# Patient Record
Sex: Female | Born: 1970 | Race: White | Hispanic: No | Marital: Married | Smoking: Never smoker
Health system: Southern US, Community
[De-identification: ages and names within clinical notes are randomized; demographics above are authoritative.]

## PROBLEM LIST (undated history)

## (undated) DIAGNOSIS — N809 Endometriosis, unspecified: Secondary | ICD-10-CM

## (undated) DIAGNOSIS — M4800 Spinal stenosis, site unspecified: Secondary | ICD-10-CM

## (undated) DIAGNOSIS — N83209 Unspecified ovarian cyst, unspecified side: Secondary | ICD-10-CM

## (undated) HISTORY — PX: NASAL SEPTUM SURGERY: SHX37

## (undated) HISTORY — PX: LAPAROSCOPY: SHX197

---

## 2015-09-04 ENCOUNTER — Encounter: Payer: Self-pay | Admitting: *Deleted

## 2015-09-04 DIAGNOSIS — R109 Unspecified abdominal pain: Secondary | ICD-10-CM | POA: Insufficient documentation

## 2015-09-04 DIAGNOSIS — R200 Anesthesia of skin: Secondary | ICD-10-CM | POA: Insufficient documentation

## 2015-09-04 DIAGNOSIS — R140 Abdominal distension (gaseous): Secondary | ICD-10-CM | POA: Insufficient documentation

## 2015-09-04 DIAGNOSIS — M546 Pain in thoracic spine: Secondary | ICD-10-CM | POA: Insufficient documentation

## 2015-09-04 DIAGNOSIS — Z3202 Encounter for pregnancy test, result negative: Secondary | ICD-10-CM | POA: Diagnosis not present

## 2015-09-04 DIAGNOSIS — M545 Low back pain: Secondary | ICD-10-CM | POA: Insufficient documentation

## 2015-09-04 LAB — URINALYSIS COMPLETE WITH MICROSCOPIC (ARMC ONLY)
Bilirubin Urine: NEGATIVE
Glucose, UA: NEGATIVE mg/dL
HGB URINE DIPSTICK: NEGATIVE
KETONES UR: NEGATIVE mg/dL
LEUKOCYTES UA: NEGATIVE
NITRITE: NEGATIVE
PROTEIN: NEGATIVE mg/dL
SPECIFIC GRAVITY, URINE: 1.016 (ref 1.005–1.030)
pH: 7 (ref 5.0–8.0)

## 2015-09-04 LAB — COMPREHENSIVE METABOLIC PANEL
ALK PHOS: 38 U/L (ref 38–126)
ALT: 13 U/L — ABNORMAL LOW (ref 14–54)
ANION GAP: 5 (ref 5–15)
AST: 18 U/L (ref 15–41)
Albumin: 4.2 g/dL (ref 3.5–5.0)
BILIRUBIN TOTAL: 0.3 mg/dL (ref 0.3–1.2)
BUN: 14 mg/dL (ref 6–20)
CALCIUM: 9.1 mg/dL (ref 8.9–10.3)
CO2: 27 mmol/L (ref 22–32)
Chloride: 106 mmol/L (ref 101–111)
Creatinine, Ser: 0.7 mg/dL (ref 0.44–1.00)
GLUCOSE: 110 mg/dL — AB (ref 65–99)
POTASSIUM: 3.3 mmol/L — AB (ref 3.5–5.1)
Sodium: 138 mmol/L (ref 135–145)
TOTAL PROTEIN: 7.1 g/dL (ref 6.5–8.1)

## 2015-09-04 LAB — CBC
HEMATOCRIT: 42.6 % (ref 35.0–47.0)
HEMOGLOBIN: 14.3 g/dL (ref 12.0–16.0)
MCH: 32.3 pg (ref 26.0–34.0)
MCHC: 33.6 g/dL (ref 32.0–36.0)
MCV: 96.2 fL (ref 80.0–100.0)
Platelets: 168 10*3/uL (ref 150–440)
RBC: 4.43 MIL/uL (ref 3.80–5.20)
RDW: 12.1 % (ref 11.5–14.5)
WBC: 8.3 10*3/uL (ref 3.6–11.0)

## 2015-09-04 LAB — POCT PREGNANCY, URINE: PREG TEST UR: NEGATIVE

## 2015-09-04 LAB — LIPASE, BLOOD: Lipase: 39 U/L (ref 11–51)

## 2015-09-04 NOTE — ED Notes (Signed)
Pt c/o low back pain x 10 days. Pt c/o mid-abdominal pain starting yesterday. Pt denies dysuria and vomiting, c/o nausea. Pt drove self to ED tonight, states she could call for a ride.

## 2015-09-05 ENCOUNTER — Emergency Department: Payer: BLUE CROSS/BLUE SHIELD

## 2015-09-05 ENCOUNTER — Emergency Department
Admission: EM | Admit: 2015-09-05 | Discharge: 2015-09-05 | Disposition: A | Discharge status: Home / Self Care | Payer: BLUE CROSS/BLUE SHIELD | Attending: Emergency Medicine | Admitting: Emergency Medicine

## 2015-09-05 DIAGNOSIS — R109 Unspecified abdominal pain: Secondary | ICD-10-CM

## 2015-09-05 HISTORY — DX: Spinal stenosis, site unspecified: M48.00

## 2015-09-05 MED ORDER — IOHEXOL 300 MG/ML  SOLN
100.0000 mL | Freq: Once | INTRAMUSCULAR | Status: AC | PRN
  Administered 2015-09-05: 100 mL via INTRAVENOUS

## 2015-09-05 MED ORDER — MORPHINE SULFATE (PF) 4 MG/ML IV SOLN
4.0000 mg | Freq: Once | INTRAVENOUS | Status: DC
  Filled 2015-09-05: qty 1

## 2015-09-05 MED ORDER — IOHEXOL 240 MG/ML SOLN
25.0000 mL | INTRAMUSCULAR | Status: AC
Start: 2015-09-05 — End: 2015-09-05
  Administered 2015-09-05 (×2): 25 mL via ORAL

## 2015-09-05 MED ORDER — ONDANSETRON HCL 4 MG/2ML IJ SOLN
4.0000 mg | Freq: Once | INTRAMUSCULAR | Status: AC
  Administered 2015-09-05: 4 mg via INTRAVENOUS
  Filled 2015-09-05: qty 2

## 2015-09-05 NOTE — ED Notes (Signed)
NAD noted at time of D/C. Pt denies questions or concerns. Pt ambulatory to the lobby at this time.  

## 2015-09-05 NOTE — Discharge Instructions (Signed)
Abdominal Pain, Adult Many things can cause abdominal pain. Usually, abdominal pain is not caused by a disease and will improve without treatment. It can often be observed and treated at home. Your health care provider will do a physical exam and possibly order blood tests and X-rays to help determine the seriousness of your pain. However, in many cases, more time must pass before a clear cause of the pain can be found. Before that point, your health care provider may not know if you need more testing or further treatment. HOME CARE INSTRUCTIONS Monitor your abdominal pain for any changes. The following actions may help to alleviate any discomfort you are experiencing:  Only take over-the-counter or prescription medicines as directed by your health care provider.  Do not take laxatives unless directed to do so by your health care provider.  Try a clear liquid diet (broth, tea, or water) as directed by your health care provider. Slowly move to a bland diet as tolerated. SEEK MEDICAL CARE IF:  You have unexplained abdominal pain.  You have abdominal pain associated with nausea or diarrhea.  You have pain when you urinate or have a bowel movement.  You experience abdominal pain that wakes you in the night.  You have abdominal pain that is worsened or improved by eating food.  You have abdominal pain that is worsened with eating fatty foods.  You have a fever. SEEK IMMEDIATE MEDICAL CARE IF:  Your pain does not go away within 2 hours.  You keep throwing up (vomiting).  Your pain is felt only in portions of the abdomen, such as the right side or the left lower portion of the abdomen.  You pass bloody or black tarry stools. MAKE SURE YOU:  Understand these instructions.  Will watch your condition.  Will get help right away if you are not doing well or get worse.   This information is not intended to replace advice given to you by your health care provider. Make sure you discuss  any questions you have with your health care provider.   Document Released: 03/16/2005 Document Revised: 02/25/2015 Document Reviewed: 02/13/2013 Elsevier Interactive Patient Education [employer]2016 Elsevier Inc.   The CAT scan shows some degenerative disc disease in your back bone at about the area where the pain is. That may explain  what is causing the pain. There is also a small hernia around your belly button which only contains fat. There is really nothing else showing up on your abdominal CT ordered lab work. Please follow-up with your regular doctor when he gets back home return here for worse pain fever vomiting or feeling sicker.

## 2015-09-05 NOTE — ED Provider Notes (Signed)
[facility] Emergency Department Provider Note  ____________________________________________  Time seen: Approximately 7:11 AM  I have reviewed the triage vital signs and the nursing notes.   HISTORY  Chief Complaint Back Pain and Abdominal Pain   HPI Erica Sheppard is a 45 y.o. female patient reports about 10 days of back pain first in the mid back and occasionally going down into the low back it's across the whole back burning in nature nothing really seemed to make it better or worse she said except for Aleve making it better. In the last 2 days patient's had some abdominal pain and bloating especially in the mid abdomen. She also reported some left arm numbness usually in the morning when she wakes up she thinks she might of been sleeping on the left arm. Pain is really only about a 2 out of 10 and did not want any pain medicine here in the emergency room. She's not had any diarrhea burning or frequency or urgency with urine. She has a neurologist and her regular doctor both in [place]. She is up here to visit her father. She sees a neurologist because she had does cervical spinal stenosis in the past.   Past Medical History  Diagnosis Date  . Spinal stenosis     There are no active problems to display for this patient.   Past Surgical History  Procedure Laterality Date  . Nasal septum surgery      No current outpatient prescriptions on file.  Allergies Review of patient's allergies indicates no known allergies.  History reviewed. No pertinent family history.  Social History Social History  Substance Use Topics  . Smoking status: Never Smoker   . Smokeless tobacco: Never Used  . Alcohol Use: Yes     Comment: 3 x / week    Review of Systems Constitutional: No fever/chills Eyes: No visual changes. ENT: No sore throat. Cardiovascular: Denies chest pain. Respiratory: Denies shortness of breath. Gastrointestinal: See history of present  illness Genitourinary: Negative for dysuria. Musculoskeletal: See history of present illness Skin: Negative for rash. Neurological: Negative for headaches, see history of present illness  10-point ROS otherwise negative.  ____________________________________________   PHYSICAL EXAM:  VITAL SIGNS: ED Triage Vitals  Enc Vitals Group     BP 09/04/15 2306 113/78 mmHg     Pulse Rate 09/04/15 2306 72     Resp 09/04/15 2306 16     Temp 09/04/15 2306 97.7 F (36.5 C)     Temp Source 09/04/15 2306 Oral     SpO2 09/04/15 2306 100 %     Weight 09/04/15 2306 127 lb (57.607 kg)     Height 09/04/15 2306  (1.676 m)     Head Cir --      Peak Flow --      Pain Score 09/04/15 2334 3     Pain Loc --      Pain Edu? --      Excl. in GC? --     Constitutional: Alert and oriented. Well appearing and in no acute distress. Eyes: Conjunctivae are normal. PERRL. EOMI. Head: Atraumatic. Nose: No congestion/rhinnorhea. Mouth/Throat: Mucous membranes are moist.  Oropharynx non-erythematous. Neck: No stridor.  Cardiovascular: Normal rate, regular rhythm. Grossly normal heart sounds.  Good peripheral circulation. Respiratory: Normal respiratory effort.  No retractions. Lungs CTAB. Gastrointestinal: Soft and nontender. No distention. No abdominal bruits. No CVA tenderness. }Musculoskeletal: No lower extremity tenderness nor edema.  No joint effusions. Back not tender to palpation  or percussion or lateral compression. There is no rash. Present in the back Neurologic:  Normal speech and language. No gross focal neurologic deficits are appreciated. No gait instability. Skin:  Skin is warm, dry and intact. No rash noted. Psychiatric: Mood and affect are normal. Speech and behavior are normal.  ____________________________________________   LABS (all labs ordered are listed, but only abnormal results are displayed)  Labs Reviewed  COMPREHENSIVE METABOLIC PANEL - Abnormal; Notable for the  following:    Potassium 3.3 (*)    Glucose, Bld 110 (*)    ALT 13 (*)    All other components within normal limits  URINALYSIS COMPLETEWITH MICROSCOPIC ([facility] ONLY) - Abnormal; Notable for the following:    Color, Urine YELLOW (*)    APPearance HAZY (*)    Bacteria, UA MANY (*)    Squamous Epithelial / LPF 0-5 (*)    All other components within normal limits  LIPASE, BLOOD  CBC  POCT PREGNANCY, URINE   ____________________________________________  EKG   ____________________________________________  RADIOLOGY  CT of the abdomen read by radiology as essentially negative there is small amount of physiologic fluid and a possible collapsed right ovarian cyst or follicle there is also some DJD at T 11 and 12 and a small containing umbilical hernia. ____________________________________________   PROCEDURES    ____________________________________________   INITIAL IMPRESSION / ASSESSMENT AND PLAN / ED COURSE  Pertinent labs & imaging results that were available during my care of the patient were reviewed by me and considered in my medical decision making (see chart for details).   ____________________________________________   FINAL CLINICAL IMPRESSION(S) / ED DIAGNOSES  Final diagnoses:  Abdominal pain, unspecified abdominal location      Arnaldo NatalPaul F Malinda, MD 09/05/15 [phone]

## 2015-11-16 ENCOUNTER — Emergency Department: Payer: BLUE CROSS/BLUE SHIELD

## 2015-11-16 ENCOUNTER — Emergency Department
Admission: EM | Admit: 2015-11-16 | Discharge: 2015-11-16 | Disposition: A | Discharge status: Home / Self Care | Payer: BLUE CROSS/BLUE SHIELD | Attending: Emergency Medicine | Admitting: Emergency Medicine

## 2015-11-16 ENCOUNTER — Encounter: Payer: Self-pay | Admitting: Emergency Medicine

## 2015-11-16 DIAGNOSIS — R1011 Right upper quadrant pain: Secondary | ICD-10-CM | POA: Diagnosis present

## 2015-11-16 DIAGNOSIS — K5900 Constipation, unspecified: Secondary | ICD-10-CM | POA: Diagnosis not present

## 2015-11-16 HISTORY — DX: Unspecified ovarian cyst, unspecified side: N83.209

## 2015-11-16 HISTORY — DX: Endometriosis, unspecified: N80.9

## 2015-11-16 LAB — CBC
HCT: 43.3 % (ref 35.0–47.0)
Hemoglobin: 14.6 g/dL (ref 12.0–16.0)
MCH: 32.2 pg (ref 26.0–34.0)
MCHC: 33.6 g/dL (ref 32.0–36.0)
MCV: 95.9 fL (ref 80.0–100.0)
Platelets: 186 10*3/uL (ref 150–440)
RBC: 4.52 MIL/uL (ref 3.80–5.20)
RDW: 12.8 % (ref 11.5–14.5)
WBC: 5.1 10*3/uL (ref 3.6–11.0)

## 2015-11-16 LAB — URINALYSIS COMPLETE WITH MICROSCOPIC (ARMC ONLY)
BILIRUBIN URINE: NEGATIVE
GLUCOSE, UA: NEGATIVE mg/dL
Ketones, ur: NEGATIVE mg/dL
Leukocytes, UA: NEGATIVE
NITRITE: NEGATIVE
Protein, ur: NEGATIVE mg/dL
RBC / HPF: NONE SEEN RBC/hpf (ref 0–5)
SPECIFIC GRAVITY, URINE: 1.008 (ref 1.005–1.030)
WBC, UA: NONE SEEN WBC/hpf (ref 0–5)
pH: 7 (ref 5.0–8.0)

## 2015-11-16 LAB — COMPREHENSIVE METABOLIC PANEL
ALBUMIN: 4.5 g/dL (ref 3.5–5.0)
ALK PHOS: 40 U/L (ref 38–126)
ALT: 12 U/L — AB (ref 14–54)
AST: 16 U/L (ref 15–41)
Anion gap: 6 (ref 5–15)
BILIRUBIN TOTAL: 0.8 mg/dL (ref 0.3–1.2)
BUN: 14 mg/dL (ref 6–20)
CALCIUM: 9.4 mg/dL (ref 8.9–10.3)
CO2: 27 mmol/L (ref 22–32)
CREATININE: 0.93 mg/dL (ref 0.44–1.00)
Chloride: 108 mmol/L (ref 101–111)
GFR calc Af Amer: >60 mL/min (ref >60)
GFR calc non Af Amer: >60 mL/min (ref >60)
GLUCOSE: 102 mg/dL — AB (ref 65–99)
Potassium: 4.1 mmol/L (ref 3.5–5.1)
Sodium: 141 mmol/L (ref 135–145)
TOTAL PROTEIN: 7.3 g/dL (ref 6.5–8.1)

## 2015-11-16 LAB — TROPONIN I

## 2015-11-16 LAB — POCT PREGNANCY, URINE: PREG TEST UR: NEGATIVE

## 2015-11-16 LAB — LIPASE, BLOOD: Lipase: 43 U/L (ref 11–51)

## 2015-11-16 MED ORDER — GI COCKTAIL ~~LOC~~
30.0000 mL | Freq: Once | ORAL | Status: AC
  Administered 2015-11-16: 30 mL via ORAL
  Filled 2015-11-16: qty 30

## 2015-11-16 MED ORDER — POLYETHYLENE GLYCOL 3350 17 G PO PACK: 17.0000 g | PACK | Freq: Every day | ORAL | Status: AC | PRN

## 2015-11-16 MED ORDER — RANITIDINE HCL 150 MG PO TABS: 150.0000 mg | ORAL_TABLET | Freq: Two times a day (BID) | ORAL | Status: AC

## 2015-11-16 NOTE — ED Provider Notes (Addendum)
[facility] Emergency Department Provider Note   ____________________________________________  Time seen: Approximately 210 PM  I have reviewed the triage vital signs and the nursing notes.   HISTORY  Chief Complaint Abdominal Pain   HPI Erica Sheppard is a 45 y.o. female with a history of spinal stenosis as well as endometriosis who is presenting to the emergency department today with upper abdominal pain. Says the pain feels like burning and is a 3 out of 10. She denies any radiation of the back. Says that she is also having intermittent constipation and had her first bowel movement this morning in 5 days. Says that the bowel movement was greenish and floated which made her concerned coming to the emergency department. She says that she "feels full" when she eats but denies any nausea or vomiting. She says the pain is worse to the right upper quadrant but is not worsened with eating. She has had a CAT scan as well as a pelvic ultrasound which has not revealed any pathology except for an involuting ovarian cyst. Patient denies any burning with urination. She says that she has tried milk of magnesia over the past several days for this but no other medications. Says the pain worsens with movement. Says that she also had a colonoscopy several years ago which did not reveal any pathology. She had this colonoscopy because she was having abdominal pain at that time as well. She did see her GI doctor several months ago for this current issue and was cleared to return in one year for follow-up. She drinks intermittently but not daily. Says that she has been more stressed over the past several months because her father is "sick."   Past Medical History  Diagnosis Date  . Spinal stenosis   . Endometriosis   . Ovarian cyst     There are no active problems to display for this patient.   Past Surgical History  Procedure Laterality Date  . Nasal septum surgery    .  Laparoscopy      No current outpatient prescriptions on file.  Allergies Review of patient's allergies indicates no known allergies.  No family history on file.  Social History Social History  Substance Use Topics  . Smoking status: Never Smoker   . Smokeless tobacco: Never Used  . Alcohol Use: Yes     Comment: 3 x / week    Review of Systems Constitutional: No fever/chills Eyes: No visual changes. ENT: No sore throat. Cardiovascular: Denies chest pain. Respiratory: Denies shortness of breath. Gastrointestinal:   No nausea, no vomiting.   Genitourinary: Negative for dysuria. Musculoskeletal: Negative for back pain. Skin: Negative for rash. Neurological: Negative for headaches, focal weakness or numbness.  10-point ROS otherwise negative.  ____________________________________________   PHYSICAL EXAM:  VITAL SIGNS: ED Triage Vitals  Enc Vitals Group     BP 11/16/15 1217 114/77 mmHg     Pulse Rate 11/16/15 1217 75     Resp 11/16/15 1217 18     Temp 11/16/15 1217 97.6 F (36.4 C)     Temp Source 11/16/15 1217 Oral     SpO2 11/16/15 1217 100 %     Weight 11/16/15 1217 127 lb (57.607 kg)     Height 11/16/15 1217 5\' 6"  (1.676 m)     Head Cir --      Peak Flow --      Pain Score 11/16/15 1218 2     Pain Loc --  Pain Edu? --      Excl. in GC? --     Constitutional: Alert and oriented. Well appearing and in no acute distress. Eyes: Conjunctivae are normal. PERRL. EOMI. Head: Atraumatic. Nose: No congestion/rhinnorhea. Mouth/Throat: Mucous membranes are moist.   Neck: No stridor.   Cardiovascular: Normal rate, regular rhythm. Grossly normal heart sounds.  Respiratory: Normal respiratory effort.  No retractions. Lungs CTAB. Gastrointestinal: Soft With mild tenderness to palpation throughout. No distention. No CVA tenderness. Musculoskeletal: No lower extremity tenderness nor edema.  No joint effusions. Neurologic:  Normal speech and language. No gross focal  neurologic deficits are appreciated. No gait instability. Skin:  Skin is warm, dry and intact. No rash noted. Psychiatric: Mood and affect are normal. Speech and behavior are normal.  ____________________________________________   LABS (all labs ordered are listed, but only abnormal results are displayed)  Labs Reviewed  COMPREHENSIVE METABOLIC PANEL - Abnormal; Notable for the following:    Glucose, Bld 102 (*)    ALT 12 (*)    All other components within normal limits  URINALYSIS COMPLETEWITH MICROSCOPIC ([facility] ONLY) - Abnormal; Notable for the following:    Color, Urine STRAW (*)    APPearance CLEAR (*)    Hgb urine dipstick 1+ (*)    Bacteria, UA RARE (*)    Squamous Epithelial / LPF 0-5 (*)    All other components within normal limits  LIPASE, BLOOD  CBC  TROPONIN I  POC URINE PREG, ED  POCT PREGNANCY, URINE   ____________________________________________  EKG ED ECG REPORT I, Schaevitz,  Teena Iraniavid M, the attending physician, personally viewed and interpreted this ECG.   Date: 11/16/2015  EKG Time: 1223  Rate: 70  Rhythm: normal sinus rhythm  Axis: Normal axis  Intervals:none  ST&T Change: No ST segment elevation or depression. No abnormal T-wave inversion.   ____________________________________________  RADIOLOGY  US Abdomen Limited RUQ (Final result) Result time: 11/16/15 15:52:48   Final result by Rad Results In Interface (11/16/15 15:52:48)   Narrative:   CLINICAL DATA: Right upper quadrant pain  EXAM: US ABDOMEN LIMITED - RIGHT UPPER QUADRANT  COMPARISON: 09/05/2015  FINDINGS: Gallbladder:  No gallstones or wall thickening visualized. No sonographic Murphy sign noted by sonographer.  Common bile duct:  Diameter: 5 mm.  Liver:  Cystic lesion is noted within the left lobe of the liver.  IMPRESSION: Left lobe cystic lesions stable from the prior CT examination. No other focal abnormality is noted.   Electronically Signed By: Alcide CleverMark  Lukens M.D. On: 11/16/2015 15:52    ____________________________________________   PROCEDURES  ____________________________________________   INITIAL IMPRESSION / ASSESSMENT AND PLAN / ED COURSE  Pertinent labs & imaging results that were available during my care of the patient were reviewed by me and considered in my medical decision making (see chart for details).  ----------------------------------------- 4:10 PM on 11/16/2015 -----------------------------------------  Patient says that the pain is reduced after taking the GI cocktail. She says the pain is completely relieved on the left side but still a 2 out of 10 to the right upper quadrant. I reviewed the lab as well as imaging results with the patient as well as her mother who is now at the bedside.  We discussed the finding of the 1.5 cm, unchanged liver cyst. The mother was also concerned about the patient's pancreas because of the history of pancreatic cancer in the family. There was no noting of pancreatic pathology on the previous CAT scan. The patient had normal biliary findings as  well as no elevations in her liver labs and bilirubin which he says there is no obstructive process. I'll be discharging the patient with MiraLAX as well as Zantac. Possibly symptoms related to constipation. She'll be following back up with her gastroenterologist in [place] where she lives. She says she is here visiting family at this time. ____________________________________________   FINAL CLINICAL IMPRESSION(S) / ED DIAGNOSES  Final diagnoses:  RUQ abdominal pain  Constipation.    NEW MEDICATIONS STARTED DURING THIS VISIT:  New Prescriptions   No medications on file     Note:  This document was prepared using Dragon voice recognition software and may include unintentional dictation errors.    Myrna Blazer, MD 11/16/15 1614  I also reviewed the scout film from the CAT scan as well as the lung fields which did  not reveal any pathology to the lower fields.  Myrna Blazer, MD 11/16/15 [phone]

## 2015-11-16 NOTE — ED Notes (Signed)
Upper R abdominal pain intermittent x 2 months.

## 2015-11-16 NOTE — Discharge Instructions (Signed)
Abdominal Pain, Adult °Many things can cause abdominal pain. Usually, abdominal pain is not caused by a disease and will improve without treatment. It can often be observed and treated at home. Your health care provider will do a physical exam and possibly order blood tests and X-rays to help determine the seriousness of your pain. However, in many cases, more time must pass before a clear cause of the pain can be found. Before that point, your health care provider may not know if you need more testing or further treatment. °HOME CARE INSTRUCTIONS °Monitor your abdominal pain for any changes. The following actions may help to alleviate any discomfort you are experiencing: °· Only take over-the-counter or prescription medicines as directed by your health care provider. °· Do not take laxatives unless directed to do so by your health care provider. °· Try a clear liquid diet (broth, tea, or water) as directed by your health care provider. Slowly move to a bland diet as tolerated. °SEEK MEDICAL CARE IF: °· You have unexplained abdominal pain. °· You have abdominal pain associated with nausea or diarrhea. °· You have pain when you urinate or have a bowel movement. °· You experience abdominal pain that wakes you in the night. °· You have abdominal pain that is worsened or improved by eating food. °· You have abdominal pain that is worsened with eating fatty foods. °· You have a fever. °SEEK IMMEDIATE MEDICAL CARE IF: °· Your pain does not go away within 2 hours. °· You keep throwing up (vomiting). °· Your pain is felt only in portions of the abdomen, such as the right side or the left lower portion of the abdomen. °· You pass bloody or black tarry stools. °MAKE SURE YOU: °· Understand these instructions. °· Will watch your condition. °· Will get help right away if you are not doing well or get worse. °  °This information is not intended to replace advice given to you by your health care provider. Make sure you discuss  any questions you have with your health care provider. °  °Document Released: 03/16/2005 Document Revised: 02/25/2015 Document Reviewed: 02/13/2013 °Elsevier Interactive Patient Education ©2016 Else[employer] Inc. ° °Constipation, Adult °Constipation is when a person has fewer than three bowel movements a week, has difficulty having a bowel movement, or has stools that are dry, hard, or larger than normal. As people grow older, constipation is more common. A low-fiber diet, not taking in enough fluids, and taking certain medicines may make constipation worse.  °CAUSES  °· Certain medicines, such as antidepressants, pain medicine, iron supplements, antacids, and water pills.   °· Certain diseases, such as diabetes, irritable bowel syndrome (IBS), thyroid disease, or depression.   °· Not drinking enough water.   °· Not eating enough fiber-rich foods.   °· Stress or travel.   °· Lack of physical activity or exercise.   °· Ignoring the urge to have a bowel movement.   °· Using laxatives too much.   °SIGNS AND SYMPTOMS  °· Having fewer than three bowel movements a week.   °· Straining to have a bowel movement.   °· Having stools that are hard, dry, or larger than normal.   °· Feeling full or bloated.   °· Pain in the lower abdomen.   °· Not feeling relief after having a bowel movement.   °DIAGNOSIS  °Your health care provider will take a medical history and perform a physical exam. Further testing may be done for severe constipation. Some tests may include: °· A barium enema X-ray to examine your rectum, colon, and, sometimes,   your small intestine.   °· A sigmoidoscopy to examine your lower colon.   °· A colonoscopy to examine your entire colon. °TREATMENT  °Treatment will depend on the severity of your constipation and what is causing it. Some dietary treatments include drinking more fluids and eating more fiber-rich foods. Lifestyle treatments may include regular exercise. If these diet and lifestyle recommendations do not  help, your health care provider may recommend taking over-the-counter laxative medicines to help you have bowel movements. Prescription medicines may be prescribed if over-the-counter medicines do not work.  °HOME CARE INSTRUCTIONS  °· Eat foods that have a lot of fiber, such as fruits, vegetables, whole grains, and beans. °· Limit foods high in fat and processed sugars, such as french fries, hamburgers, cookies, candies, and soda.   °· A fiber supplement may be added to your diet if you cannot get enough fiber from foods.   °· Drink enough fluids to keep your urine clear or pale yellow.   °· Exercise regularly or as directed by your health care provider.   °· Go to the restroom when you have the urge to go. Do not hold it.   °· Only take over-the-counter or prescription medicines as directed by your health care provider. Do not take other medicines for constipation without talking to your health care provider first.   °SEEK IMMEDIATE MEDICAL CARE IF:  °· You have bright red blood in your stool.   °· Your constipation lasts for more than 4 days or gets worse.   °· You have abdominal or rectal pain.   °· You have thin, pencil-like stools.   °· You have unexplained weight loss. °MAKE SURE YOU:  °· Understand these instructions. °· Will watch your condition. °· Will get help right away if you are not doing well or get worse. °  °This information is not intended to replace advice given to you by your health care provider. Make sure you discuss any questions you have with your health care provider. °  °Document Released: 03/04/2004 Document Revised: 06/27/2014 Document Reviewed: 03/18/2013 °Elsevier Interactive Patient Education ©2016 [employer] ° °

## 2017-02-21 IMAGING — US US ABDOMEN LIMITED
1 series · 14 of 25 positions shown · non-contrast
Comparison: 09/05/2015

CLINICAL DATA: Right upper quadrant pain

EXAM:
US ABDOMEN LIMITED - RIGHT UPPER QUADRANT

[Series 1: us abdomen limited · 0.17mm/px · 14 of 59 slices shown]
[im 1/59]
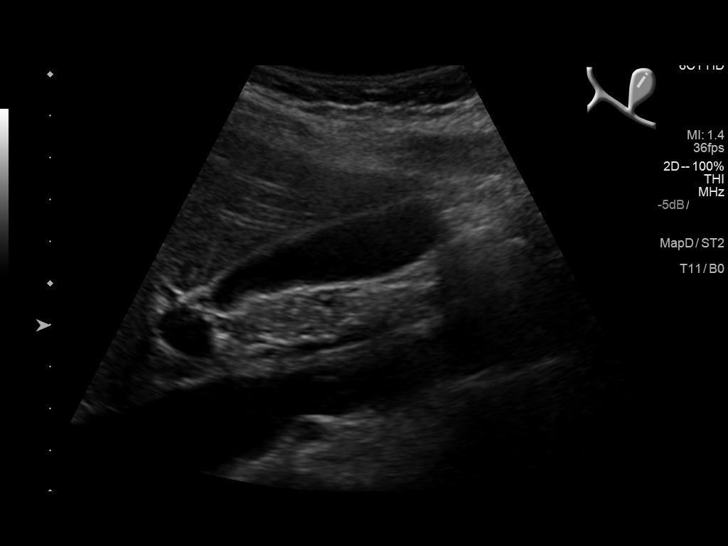
[im 5/59]
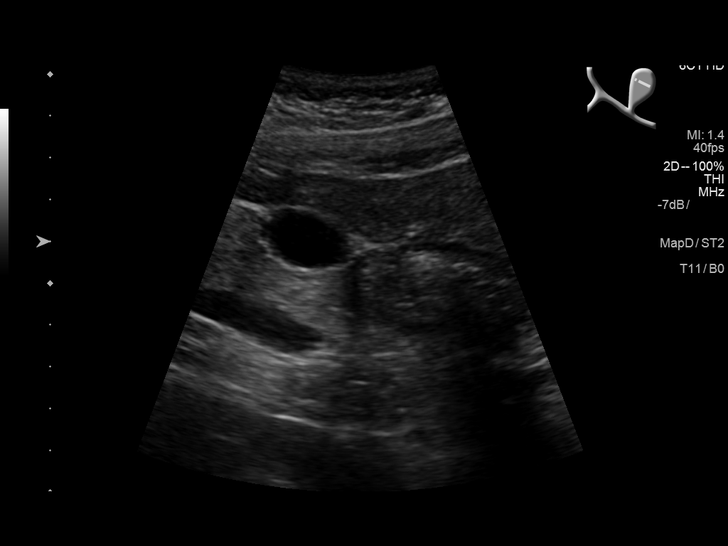
[im 10/59]
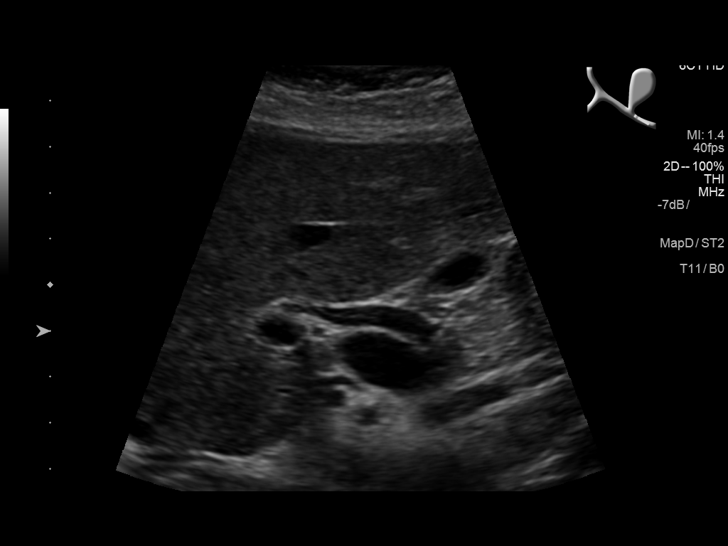
[im 15/59]
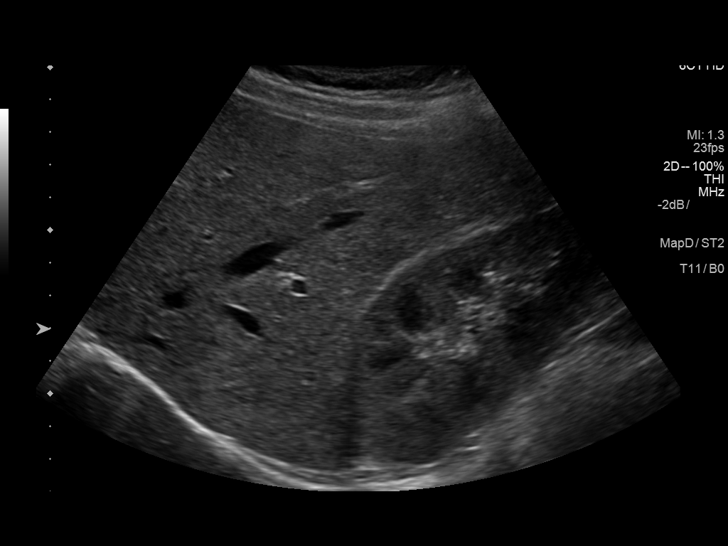
[im 20/59]
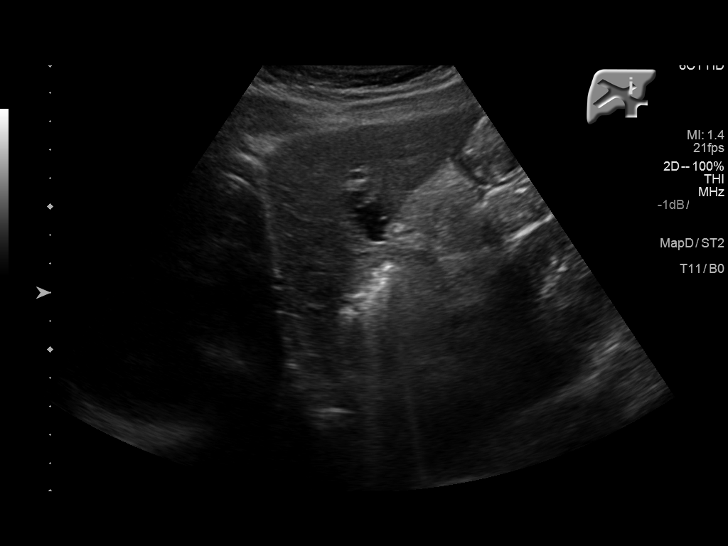
[im 22/59]
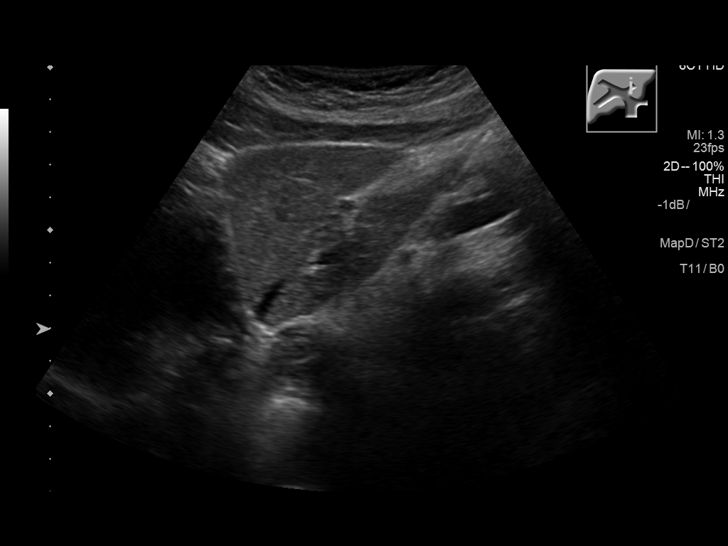
[im 27/59]
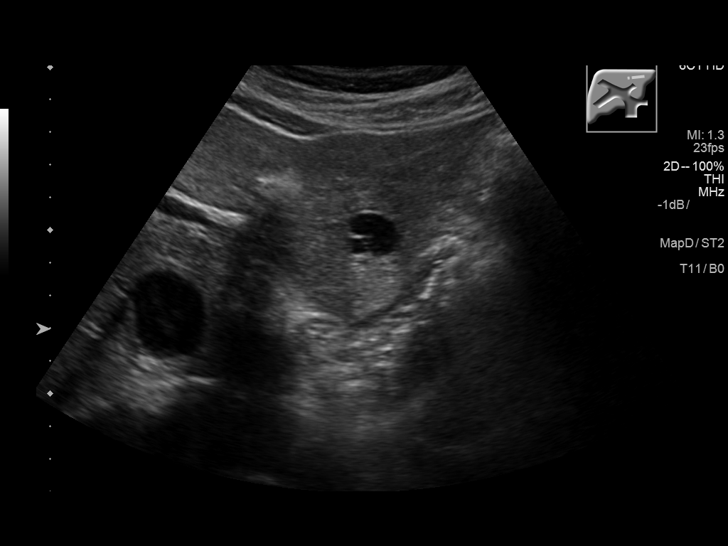
[im 32/59]
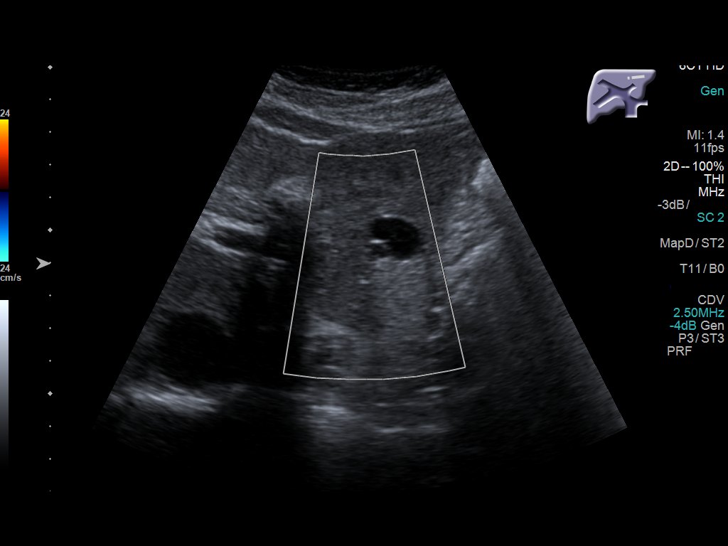
[im 37/59]
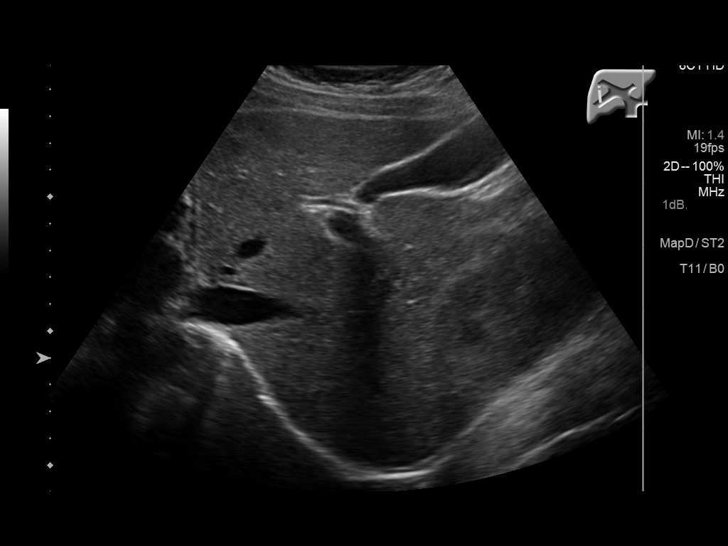
[im 39/59]
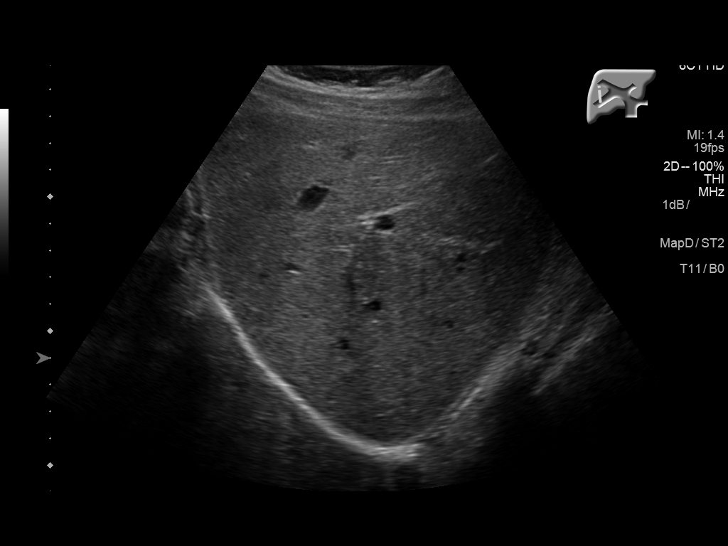
[im 44/59]
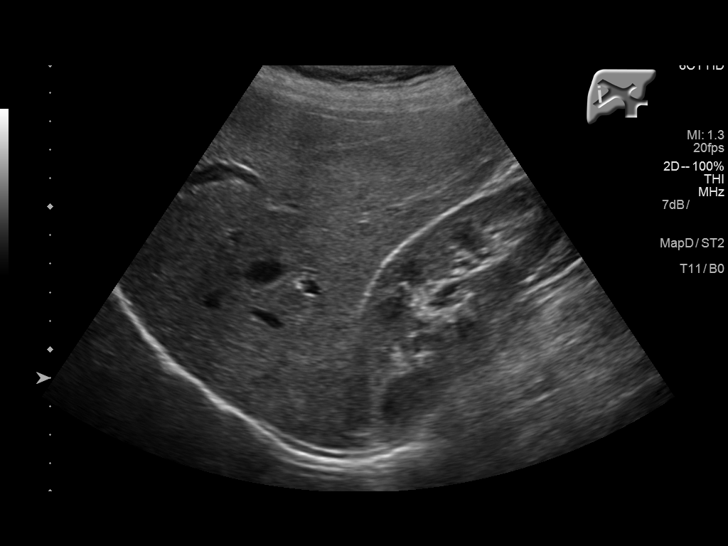
[im 49/59]
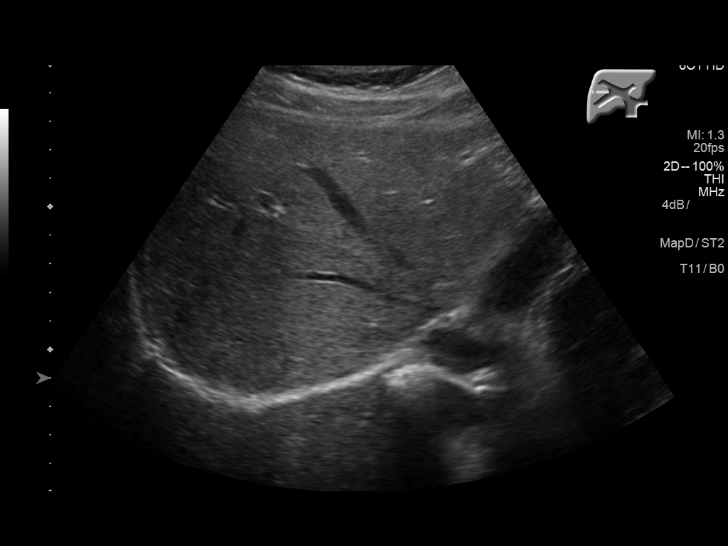
[im 54/59]
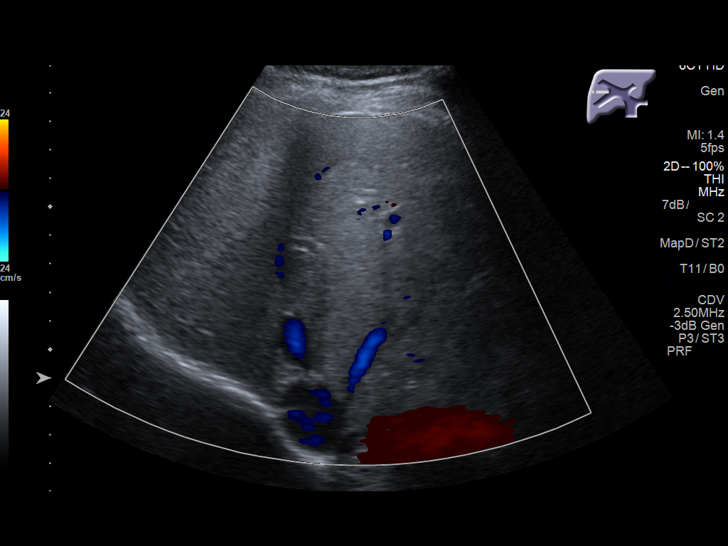
[im 59/59]
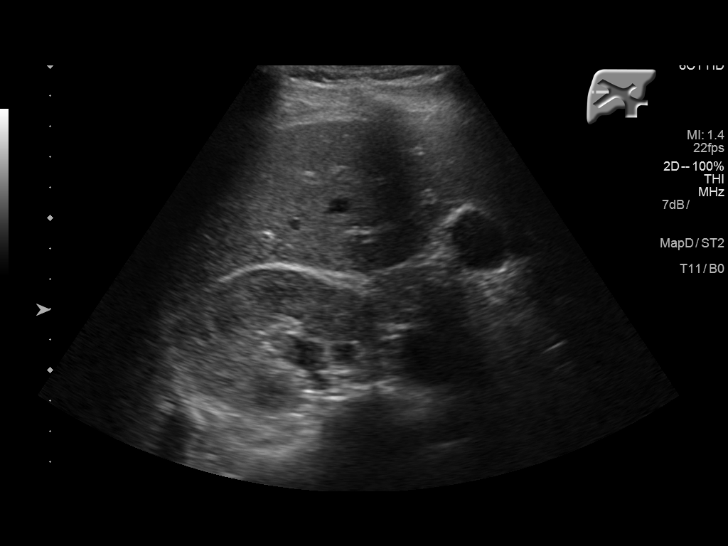

[14 of 25 positions shown; findings below may reference images not displayed]

FINDINGS: Gallbladder:

No gallstones or wall thickening visualized. No sonographic Murphy
sign noted by sonographer.

Common bile duct:

Diameter: 5 mm.

Liver:

Cystic lesion is noted within the left lobe of the liver.
IMPRESSION: Left lobe cystic lesions stable from the prior CT examination. No
other focal abnormality is noted.

## 2017-10-28 IMAGING — CT CT ABD-PELV W/ CM
1 of 2 series · 15 of 32 positions shown, 19 images · IV contrast (omnipaque)
Comparison: None.

CLINICAL DATA: Low back pain for 10 days, now with mid abdominal
pain onset yesterday.

EXAM:
CT ABDOMEN AND PELVIS WITH CONTRAST
TECHNIQUE: Multidetector CT imaging of the abdomen and pelvis was performed
using the standard protocol following bolus administration of
intravenous contrast.
CONTRAST:  100mL OMNIPAQUE IOHEXOL 300 MG/ML  SOLN

[Series 2: routine abd pel with · axial · 0.62mm/px · z∈[-490,-80]mm · 15 of 90 slices shown, 19 images]
[im 4/90  soft-tissue]
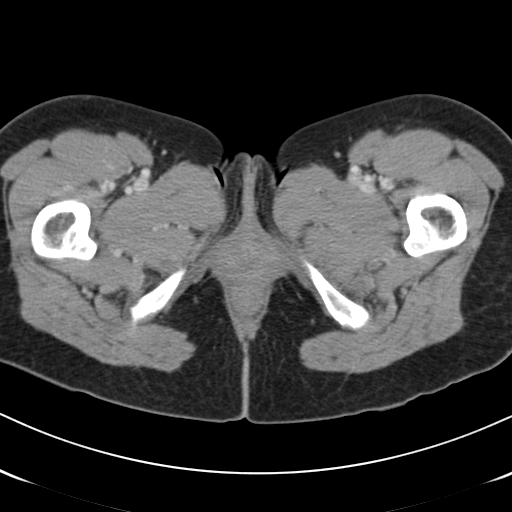
[im 4/90  bone]
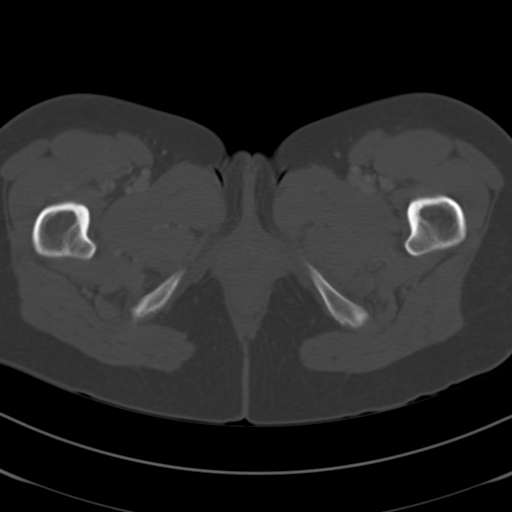
[im 12/90  soft-tissue]
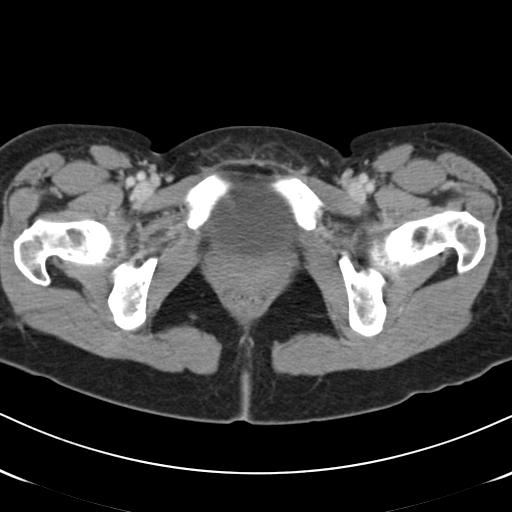
[im 19/90  soft-tissue]
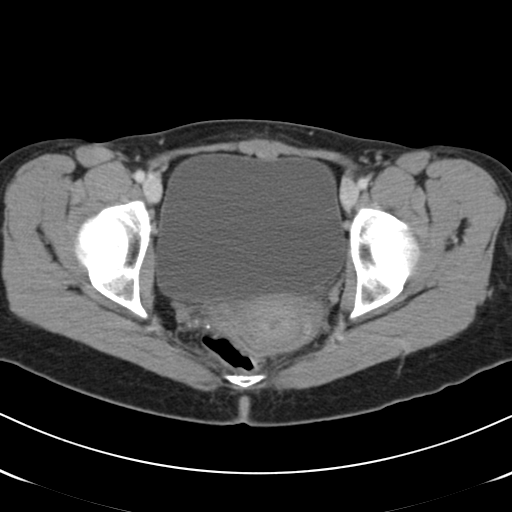
[im 26/90  soft-tissue]
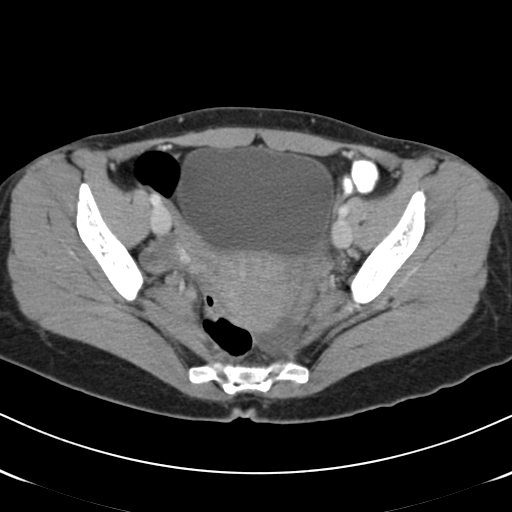
[im 30/90  soft-tissue]
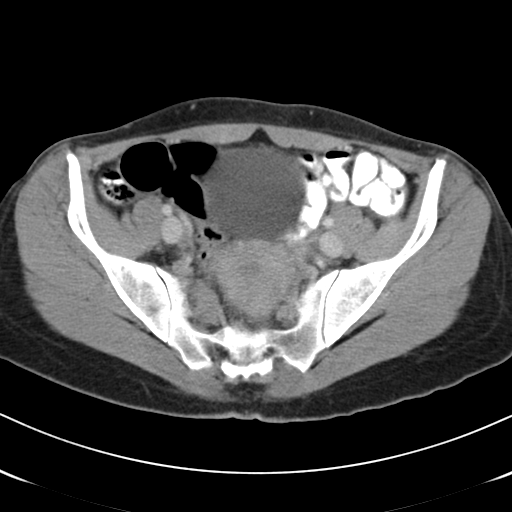
[im 38/90  soft-tissue]
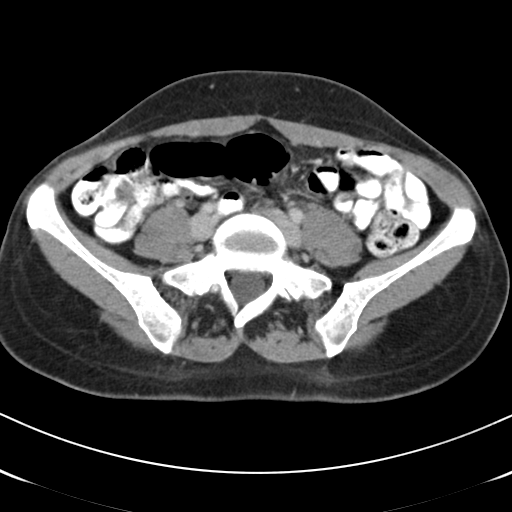
[im 45/90  soft-tissue]
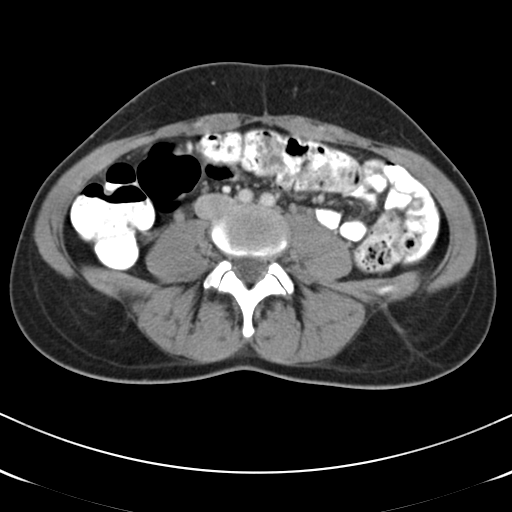
[im 52/90  soft-tissue]
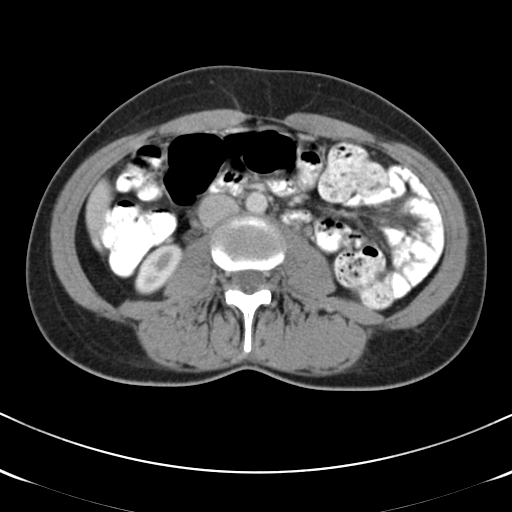
[im 60/90  soft-tissue]
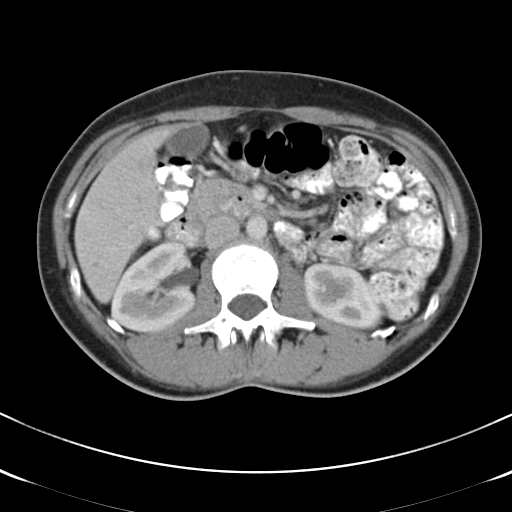
[im 60/90  bone]
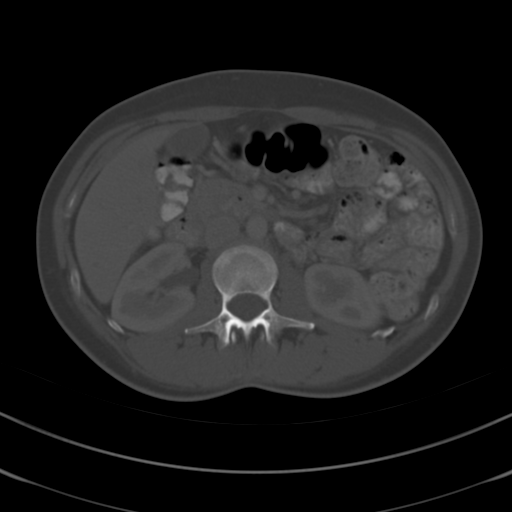
[im 64/90  soft-tissue]
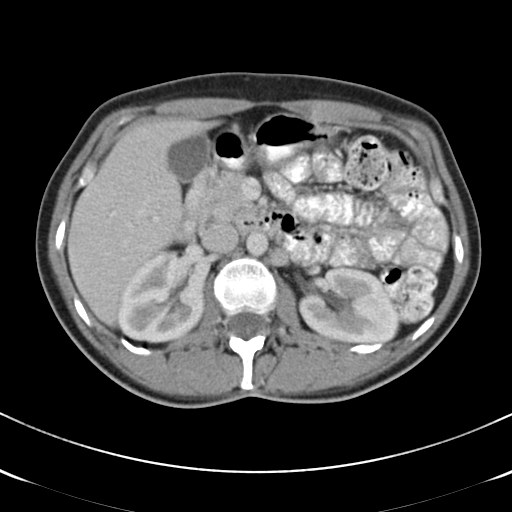
[im 71/90  soft-tissue]
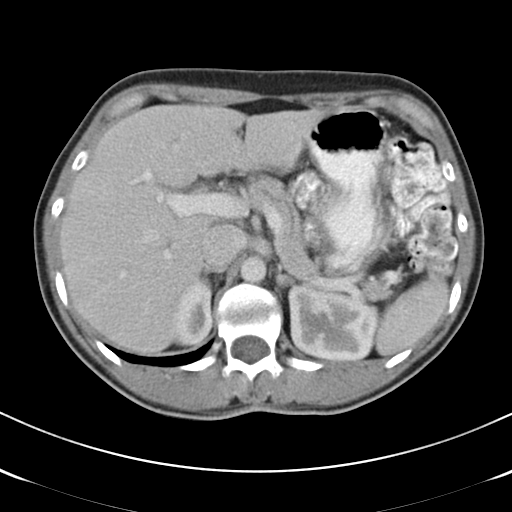
[im 75/90  lung]
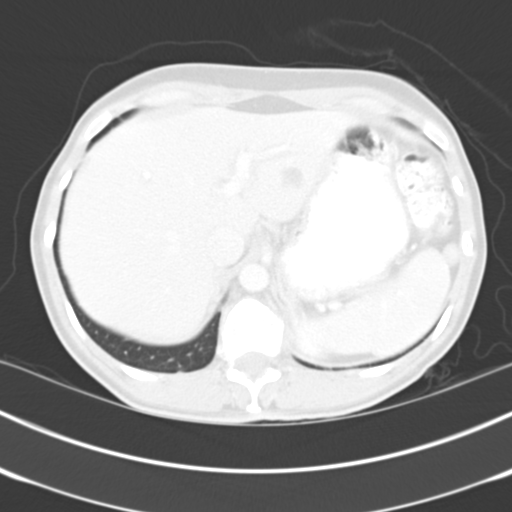
[im 78/90  soft-tissue]
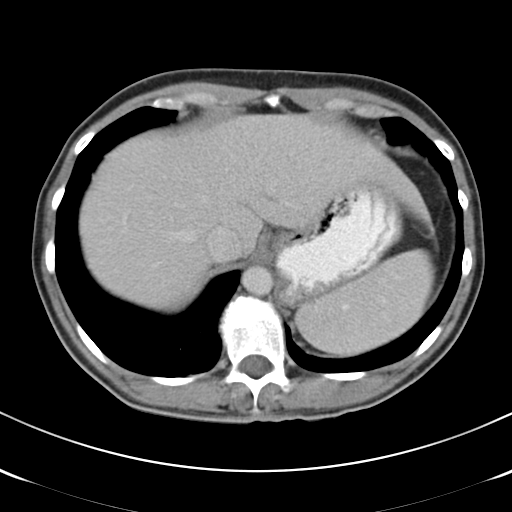
[im 78/90  lung]
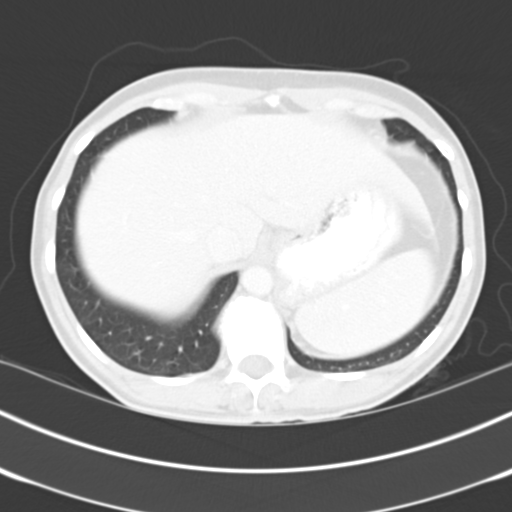
[im 82/90  lung]
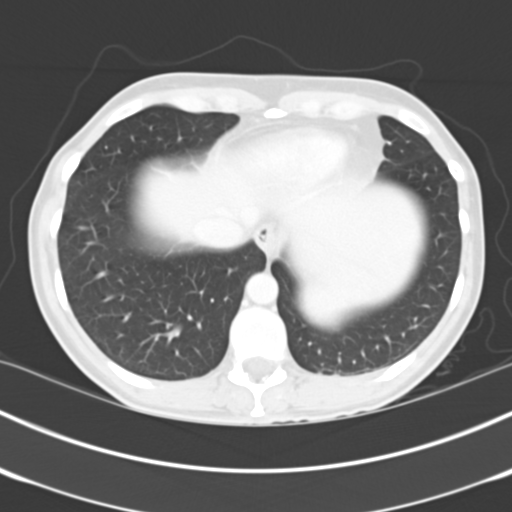
[im 86/90  soft-tissue]
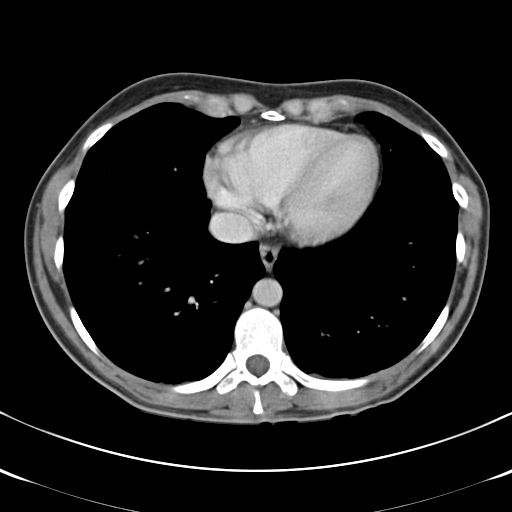
[im 86/90  lung]
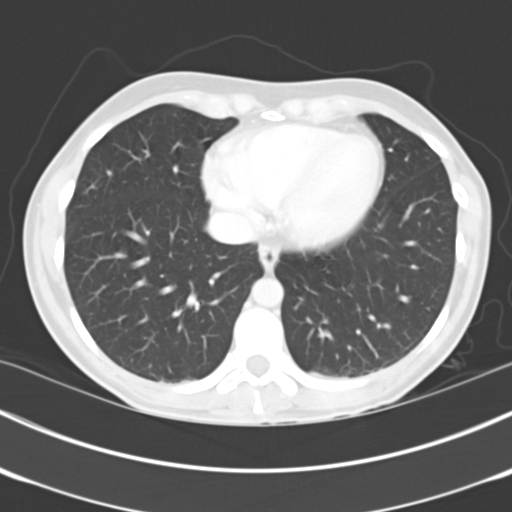

[15 of 32 positions shown; findings below may reference images not displayed]

FINDINGS: Lower chest:  No significant abnormality

Hepatobiliary: There are several small hepatic hypodensities, 1 of
which measures 1.5 cm in the left lobe; this is a simple cyst. The
others are too small for definitive characterization. There are
otherwise normal appearances of the liver, gallbladder and bile
ducts.

Pancreas: Normal

Spleen: Normal

Adrenals/Urinary Tract: The adrenals and kidneys are normal in
appearance. There is no urinary calculus evident. There is no
hydronephrosis or ureteral dilatation. Collecting systems and
ureters appear unremarkable.

Stomach/Bowel: There are normal appearances of the stomach, small
bowel and colon. The appendix is normal.

Vascular/Lymphatic: The abdominal aorta is normal in caliber. There
is no atherosclerotic calcification. There is no adenopathy in the
abdomen or pelvis.

Reproductive: The uterus and left ovary appear normal. There is a
partially collapsed peripherally enhancing 1.9 cm right ovarian cyst
or follicle, likely physiologic. There is a small volume free fluid
in the pelvic cul-de-sac.

Other: No acute inflammatory changes are evident in the abdomen or
pelvis.

Musculoskeletal: No significant skeletal lesion. Moderate
degenerative thoracic disc disease is present at T11-12. Small fat
containing umbilical hernia.
IMPRESSION: Small volume free fluid in the pelvic cul-de-sac, likely
physiologic. Partially collapsed right ovarian cyst or follicle.
